# Patient Record
Sex: Female | Born: 1935 | Race: White | Hispanic: No
Health system: Southern US, Community
[De-identification: ages and names within clinical notes are randomized; demographics above are authoritative.]

---

## 2009-01-28 ENCOUNTER — Ambulatory Visit: Payer: Self-pay | Admitting: Internal Medicine

## 2009-04-01 ENCOUNTER — Ambulatory Visit: Payer: Self-pay | Admitting: Internal Medicine

## 2009-05-19 ENCOUNTER — Inpatient Hospital Stay: Payer: Self-pay | Admitting: Orthopedic Surgery

## 2009-06-28 ENCOUNTER — Ambulatory Visit: Payer: Self-pay | Admitting: Family Medicine

## 2010-01-11 ENCOUNTER — Inpatient Hospital Stay: Payer: Self-pay | Admitting: Specialist

## 2011-11-05 ENCOUNTER — Ambulatory Visit: Payer: Self-pay | Admitting: Specialist

## 2011-12-03 ENCOUNTER — Inpatient Hospital Stay: Payer: Self-pay | Admitting: Internal Medicine

## 2011-12-03 LAB — BASIC METABOLIC PANEL
Anion Gap: 10 (ref 7–16)
Calcium, Total: 9.3 mg/dL (ref 8.5–10.1)
Chloride: 104 mmol/L (ref 98–107)
Creatinine: 0.74 mg/dL (ref 0.60–1.30)
EGFR (African American): >60
EGFR (Non-African Amer.): >60
Osmolality: 281 (ref 275–301)
Potassium: 3.7 mmol/L (ref 3.5–5.1)
Sodium: 141 mmol/L (ref 136–145)

## 2011-12-03 LAB — URINALYSIS, COMPLETE
Bilirubin,UR: NEGATIVE
Blood: NEGATIVE
Glucose,UR: NEGATIVE mg/dL (ref 0–75)
Nitrite: NEGATIVE
Ph: 8 (ref 4.5–8.0)
Protein: NEGATIVE
RBC,UR: 2 /HPF (ref 0–5)
WBC UR: <1 /HPF (ref 0–5)

## 2011-12-03 LAB — CBC
HCT: 41.4 % (ref 35.0–47.0)
HGB: 13.4 g/dL (ref 12.0–16.0)
MCHC: 32.5 g/dL (ref 32.0–36.0)
Platelet: 439 10*3/uL (ref 150–440)
RBC: 4.32 10*6/uL (ref 3.80–5.20)
WBC: 7.9 10*3/uL (ref 3.6–11.0)

## 2011-12-03 LAB — PROTIME-INR: INR: 1

## 2011-12-03 LAB — APTT: Activated PTT: 31.6 secs (ref 23.6–35.9)

## 2011-12-08 ENCOUNTER — Encounter: Payer: Self-pay | Admitting: Internal Medicine

## 2011-12-17 ENCOUNTER — Ambulatory Visit: Payer: Self-pay | Admitting: Internal Medicine

## 2011-12-28 ENCOUNTER — Ambulatory Visit: Payer: Self-pay | Admitting: Family Medicine

## 2011-12-31 ENCOUNTER — Encounter: Payer: Self-pay | Admitting: Internal Medicine

## 2012-02-06 ENCOUNTER — Ambulatory Visit: Payer: Self-pay | Admitting: Emergency Medicine

## 2012-02-11 ENCOUNTER — Ambulatory Visit: Payer: Self-pay | Admitting: Family Medicine

## 2012-02-16 ENCOUNTER — Ambulatory Visit: Payer: Self-pay | Admitting: Emergency Medicine

## 2012-02-23 ENCOUNTER — Ambulatory Visit: Payer: Self-pay | Admitting: Emergency Medicine

## 2013-06-29 ENCOUNTER — Emergency Department: Payer: Self-pay | Admitting: Emergency Medicine

## 2013-06-29 LAB — URINALYSIS, COMPLETE
Bilirubin,UR: NEGATIVE
Blood: NEGATIVE
Ketone: NEGATIVE
Nitrite: NEGATIVE
Ph: 9 (ref 4.5–8.0)
Protein: NEGATIVE
RBC,UR: 2 /HPF (ref 0–5)

## 2013-06-29 LAB — COMPREHENSIVE METABOLIC PANEL
Albumin: 3.5 g/dL (ref 3.4–5.0)
BUN: 7 mg/dL (ref 7–18)
Bilirubin,Total: 0.4 mg/dL (ref 0.2–1.0)
Calcium, Total: 9.6 mg/dL (ref 8.5–10.1)
Co2: 30 mmol/L (ref 21–32)
Creatinine: 0.74 mg/dL (ref 0.60–1.30)
EGFR (African American): >60
Glucose: 104 mg/dL — ABNORMAL HIGH (ref 65–99)
Potassium: 3.4 mmol/L — ABNORMAL LOW (ref 3.5–5.1)
SGOT(AST): 17 U/L (ref 15–37)
SGPT (ALT): 16 U/L (ref 12–78)
Sodium: 133 mmol/L — ABNORMAL LOW (ref 136–145)
Total Protein: 7.1 g/dL (ref 6.4–8.2)

## 2013-06-29 LAB — CBC
HCT: 35.1 % (ref 35.0–47.0)
HGB: 11.9 g/dL — ABNORMAL LOW (ref 12.0–16.0)
MCHC: 33.9 g/dL (ref 32.0–36.0)
RBC: 3.79 10*6/uL — ABNORMAL LOW (ref 3.80–5.20)
RDW: 14.2 % (ref 11.5–14.5)
WBC: 5.5 10*3/uL (ref 3.6–11.0)

## 2013-06-29 LAB — TROPONIN I: Troponin-I: <0.02 ng/mL

## 2013-06-29 LAB — CK TOTAL AND CKMB (NOT AT ARMC): CK, Total: 36 U/L (ref 21–215)

## 2013-06-29 LAB — PRO B NATRIURETIC PEPTIDE: B-Type Natriuretic Peptide: 857 pg/mL — ABNORMAL HIGH (ref 0–450)

## 2013-10-02 IMAGING — RF DG BARIUM SWALLOW
1 series · 14 of 23 positions shown · non-contrast
Comparison: none

REASON FOR EXAM: difficulty swallowing   hiatal hernia
COMMENTS:

[Series 1: run · 17 acquisitions, 14 frames shown]
[im 1/17]
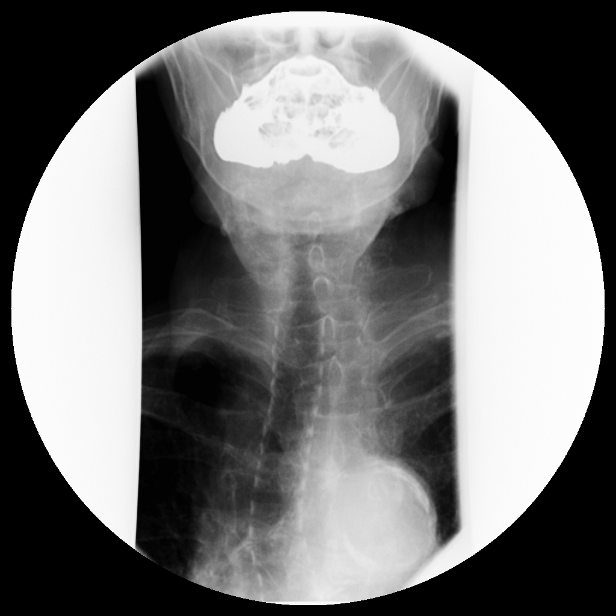
[im 1/17]
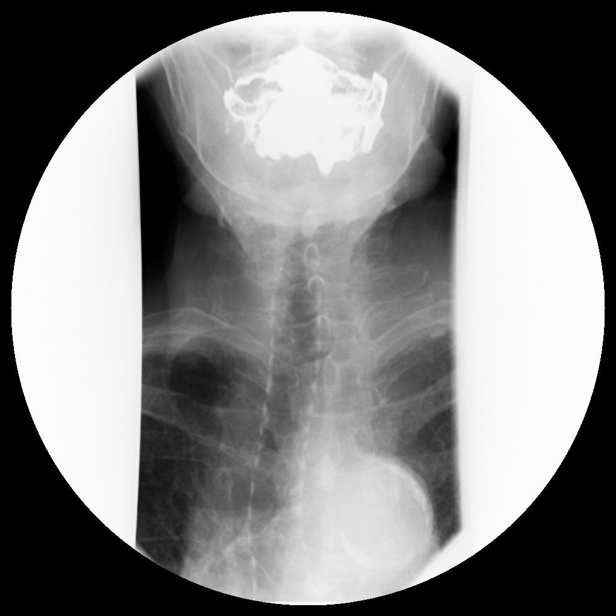
[im 2/17]
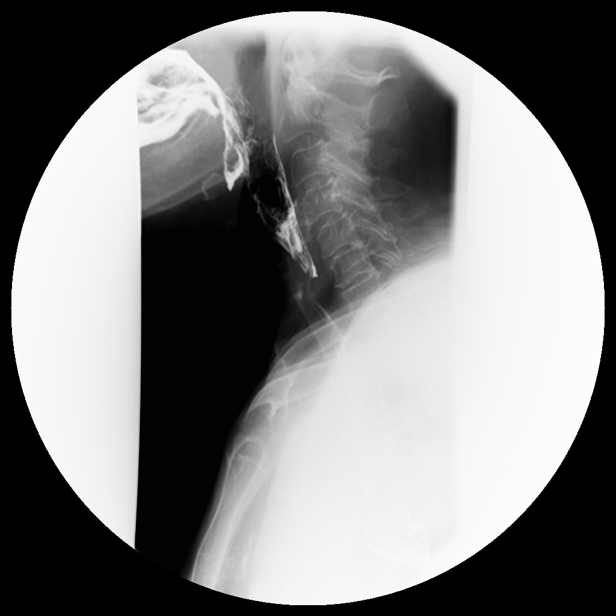
[im 2/17]
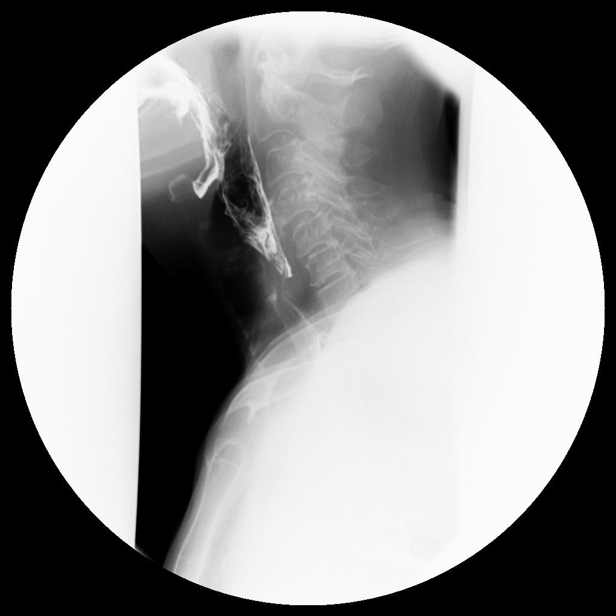
[im 2/17]
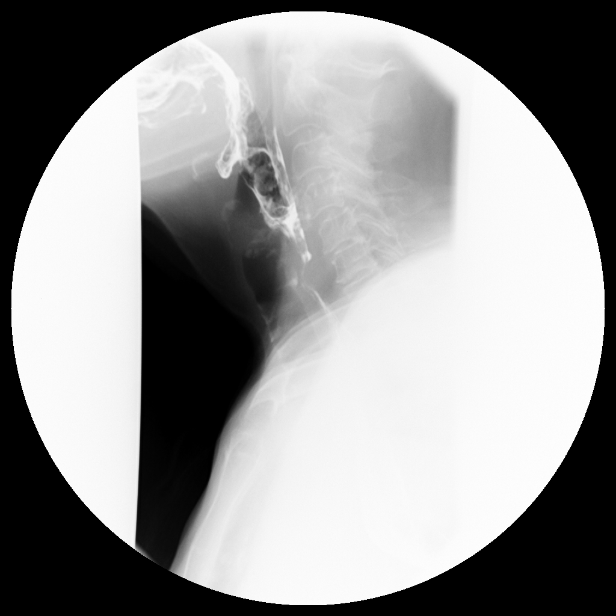
[im 4/17]
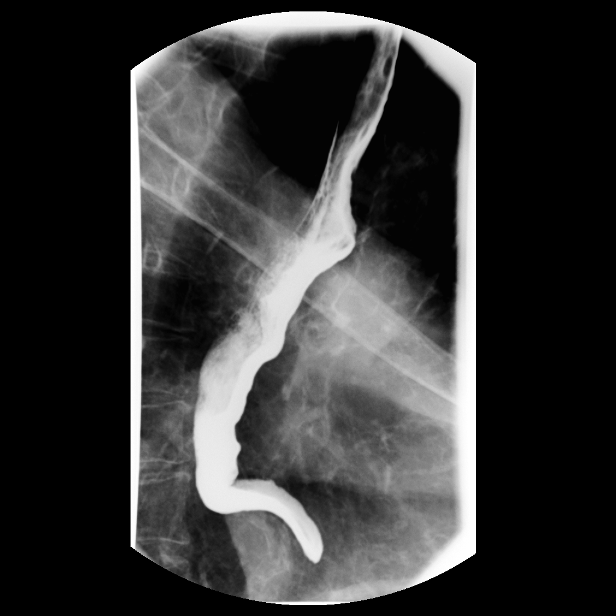
[im 5/17]
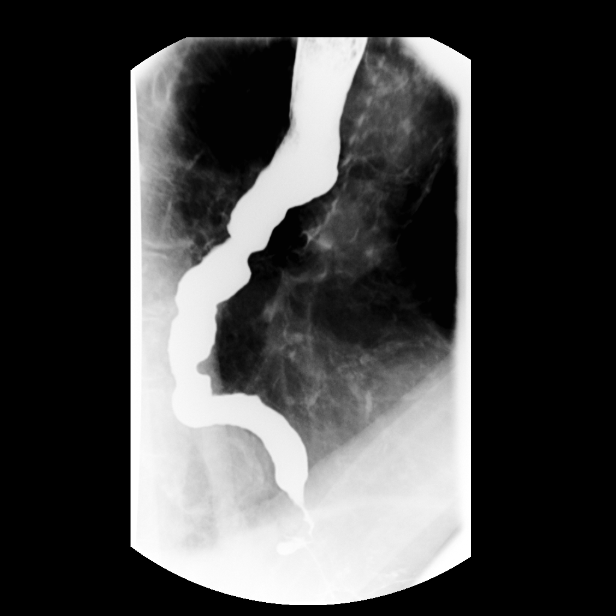
[im 7/17]
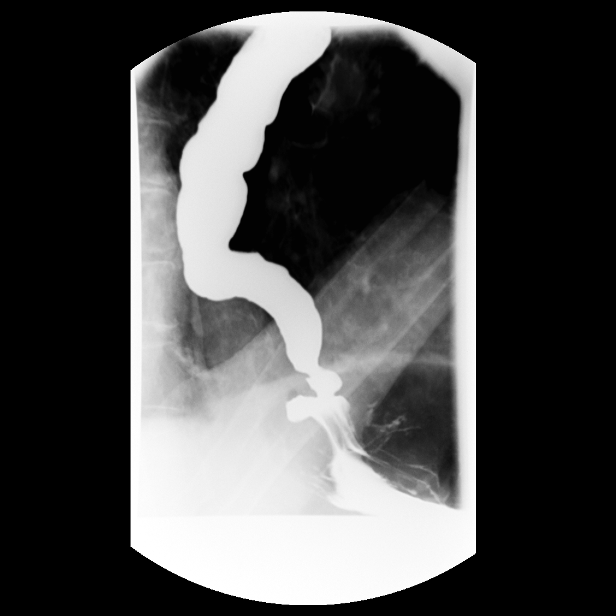
[im 8/17]
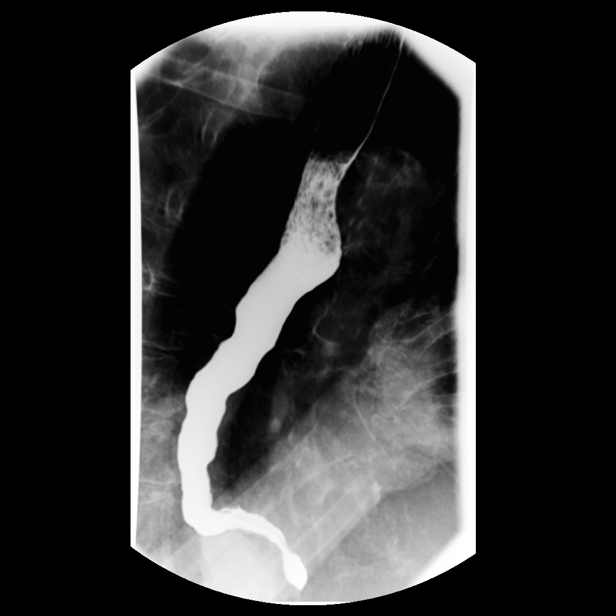
[im 10/17]
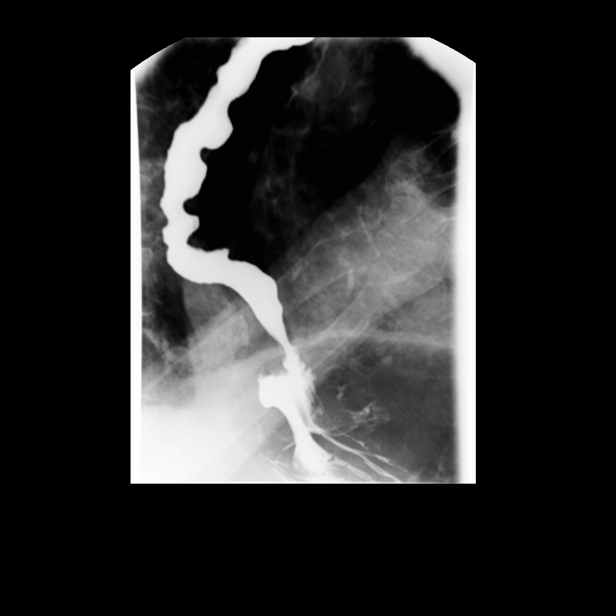
[im 12/17]
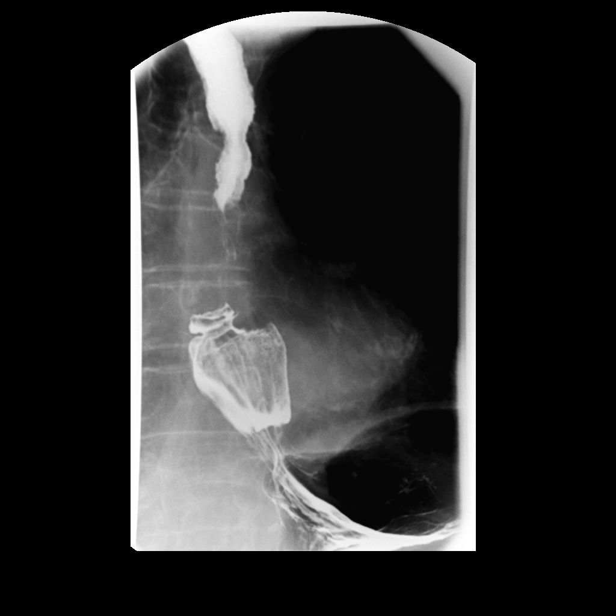
[im 13/17]
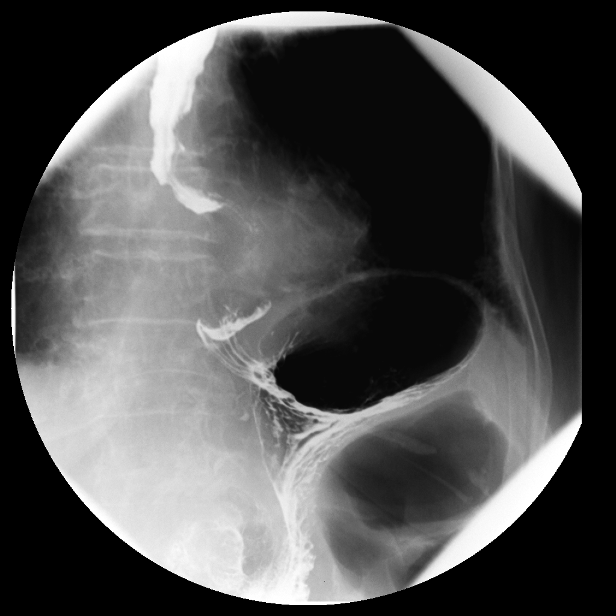
[im 15/17]
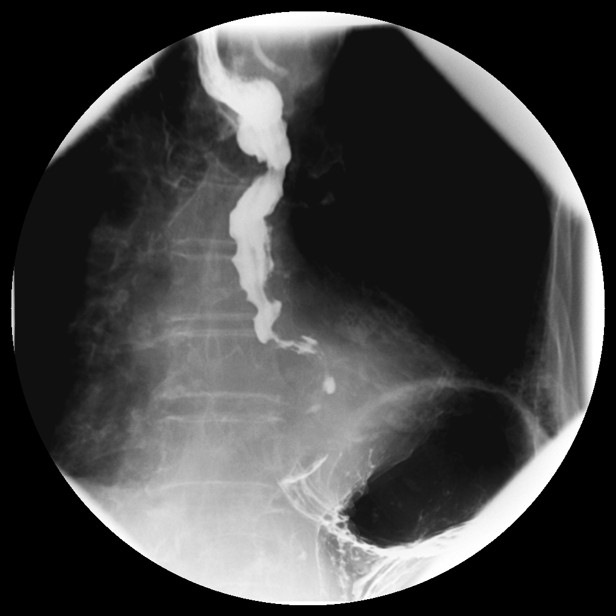
[im 17/17]
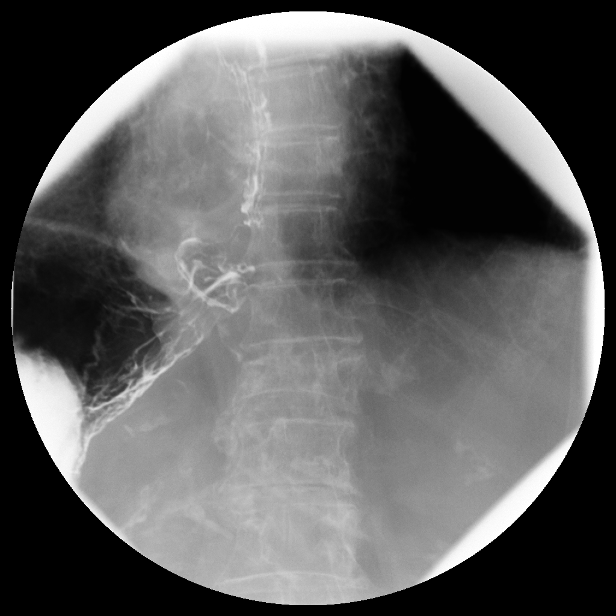

[14 of 23 positions shown; findings below may reference images not displayed]

PROCEDURE:     FL  - FL BARIUM SWALLOW  - December 17, 2011  [DATE]

RESULT:

Procedure: Dynamic imaging of the cervical esophagus was performed during
active swallowing. This was followed by the administration of effervescent
crystals and readministration of oral barium. Representative spot imaging
was obtained.

Evaluation of the cervical esophagus demonstrates a persistent area of
irregularity along the anterior aspect of the proximal esophagus at the
level of the C5 vertebral body. This may represent the sequela of an ulcer
within the proximal cervical esophagus. Further evaluation with direct
visualization is recommended.

Status post readministration of oral barium multiple filling defects are
identified within the thoracic esophagus suspicious for debris. The patient
demonstrates constant tertiary contractions during swallowing and the
esophagus is tortuous. A small hiatal hernia is identified within the distal
esophagus. There are findings which may represent an area of mild narrowing
within the distal esophagus. The study was aborted secondary to the
patient's complaint of pain during swallowing as well as patient's
complaints of pain during positioning.
IMPRESSION: 1. Findings concerning for filling defect possibly ulcer along the anterior
surface of the upper to mid cervical esophagus.
2. Findings which may represent mild narrowing of the distal thoracic
esophagus. A mass within this area cannot be completely excluded and further
evaluation with direct visualization is recommended.
3. Hiatal hernia.
4. This study was also aborted secondary to the residual debris within the
esophagus and the concern of aspiration during persistent administration of
barium.

## 2014-05-02 DEATH — deceased

## 2014-12-24 NOTE — Discharge Summary (Signed)
PATIENT NAME:  Olivia Holloway, ROENIA S MR#:  [id number] DATE OF BIRTH:  12/13/1917  DATE OF ADMISSION:  12/03/2011 DATE OF DISCHARGE:  12/08/2011   DISCHARGE DIAGNOSES:  1. Vertebral compression fracture. 2. Ileus.  3. Abdominal aortic aneurysm measuring 7.8 cm, not a surgical candidate.  4. Emphysema.   DISCHARGE MEDICATIONS:  1. Aspirin 81 mg daily.  2. Fiber-Lax 625 mg daily.  3. Milk of Magnesia 30 mL p.r.n.  4. Simethicone 125 mg daily.  5. Ventolin 1 puff b.i.d.  6. Advair 250/50 1 puff b.i.d.  7. Norco 5/325 one-half tab q.6 p.r.n.  8. Magnesium citrate 30 to 60 mL b.i.d. p.r.n. constipation.  9. MiraLAX 17 grams daily as needed for constipation.   REASON FOR ADMISSION: This is a 81 year old female who fell and suffered a T12-L3 compression fracture. She is not a candidate for kyphoplasty because of her advanced age and her large abdominal aortic aneurysm. She does complain of pain. She was admitted for further evaluation.   HOSPITAL COURSE:  1. Vertebral compression fracture. She was seen by physical therapy and ambulated with assistance. Pain took a few days to get controlled but she is ambulating now with a walker and will be getting home PT.  2. Ileus. She was going to be discharged yesterday but she had abdominal distention and constipation. X-ray was ordered and showed an ileus with a possible partial large bowel obstruction. Last night she had three bowel movements. Her abdomen is soft and nondistended today, good bowel sounds. Follow-up x-ray shows a little bit improved but she is tolerating diet so at this point will go ahead and send her home with the above bowel regimen if needed.  3. Abdominal aortic aneurysm now measuring 7.8 cm which is larger than last time it was measured in May of 2011. However, at this point she is not even being followed by repeat ultrasounds or CTs because she has been deemed not a surgical candidate and the family wishes no further work-up.   4. Emphysema. She was continued on her current medications.   DISPOSITION:  1. The patient is discharged in stable condition.  2. She will go home with home health care and PT. 3. She will follow-up with her primary care physician in 1 to 2 weeks.   TIME SPENT ON DISCHARGE: 35 minutes.   ____________________________ Gracelyn NurseJohn D. Johnston, MD [email or web address]:drc D: 12/06/2011 09:23:49 ET T: 12/08/2011 11:36:57 ET JOB#: [id number]  cc: Gracelyn NurseJohn D. Johnston, MD, <Dictator> Jorje GuildGlenn R. Beckey DowningWillett, MD Gracelyn NurseJOHN D JOHNSTON MD ELECTRONICALLY SIGNED 12/18/2011 23:30

## 2014-12-24 NOTE — Discharge Summary (Signed)
PATIENT NAME:  Olivia RilingRIPP, Olivia S MR#:  [id number] DATE OF BIRTH:  1918/05/07  DATE OF ADMISSION:  12/03/2011 DATE OF DISCHARGE:  12/08/2011  ADDENDUM TO THE DISCHARGE SUMMARY DICTATED BY DR. Letitia LibraJOHNSTON ON 12/06/2011  FINAL MEDICATION LIST:   1. Aspirin 81 mg p.o. daily.  2. Tylenol 650 mg p.o. q.6 p.r.n.  3. Norco 5/325 mg 1 to 2 q.6 p.r.n.  4. Advair 250/50, 1 puff b.i.d.  5. Restoril 7.5 mg p.o. at bedtime.  6. FiberCon 625 mg p.o. daily.  7. Albuterol oral inhaler 2 puffs q.4 p.r.n.  8. Mylanta 80 mg p.o. daily.  9. Milk of magnesia 30 mL p.o. b.i.d. p.r.n.   FOLLOW UP: Follow up with Dr. Barry BrunnerGlenn Willett after discharge from rehab in 1 to 2 weeks.   HOSPITAL COURSE: Patient was doing well. She tolerated physical therapy well. With her back pain family was concerned about her being discharged to home hence she was evaluated by physical therapy. Discussed with care managers and patient will be discharged to rehab today once family picks a choice on the rehab facility. Patient remained a NO CODE, DO NOT RESUSCITATE.   TIME SPENT: 40 minutes.  ____________________________ Wylie HailSona A. Allena KatzPatel, MD sap:cms D: 12/08/2011 11:38:08 ET T: 12/08/2011 12:09:56 ET JOB#: [id number] cc: Broden Holt A. Allena KatzPatel, MD, <Dictator> Jorje GuildGlenn R. Beckey DowningWillett, MD Willow OraSONA A Llewelyn Sheaffer MD ELECTRONICALLY SIGNED 12/08/2011 14:42

## 2014-12-24 NOTE — H&P (Signed)
PATIENT NAME:  Olivia Holloway, Olivia Holloway MR#:  [id number] DATE OF BIRTH:  September 16, 1917  DATE OF ADMISSION:  12/03/2011  PRIMARY CARE PHYSICIAN:  Dr. Barry Brunner  CHIEF COMPLAINT: Back pain.   HISTORY OF PRESENT ILLNESS: This is a 81 year old female who fell earlier today. She suffered a T12-L1 compression fracture. She is not a candidate for kyphoplasty because of her age and also she has a large abdominal aortic aneurysm. She complains of back pain with pressure on her feet. She also complains of some soreness around the abdominal area from when she said she twisted when she fell. She is very hard of hearing. She is able to answer all questions. Her daughters that are in the room with her say she is a little bit hyped up from the morphine they gave her.   PAST MEDICAL HISTORY:  1. Emphysema.  2. Reflux.  3. 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  4. Abdominal aortic aneurysm last measuring 6.8 cm in May 2011. Deemed nonsurgical because of age.   PAST SURGICAL HISTORY:  1. Tonsillectomy.  2. Uterine surgery.   ALLERGIES: Sulfa.   CURRENT MEDICATIONS:  1. Aspirin 81 mg daily.  2. Milk of Magnesia p.r.n.  3. Ventolin metered-dose inhaler 2 puffs q. 4 p.r.n.  4. Advair Diskus 250/50 1 puff daily.   SOCIAL HISTORY: Lives at home.   FAMILY HISTORY: Significant for brain aneurysm in her father.     REVIEW OF SYSTEMS: CONSTITUTIONAL: No fever or chills. EYES: She has some blurred vision. HEENT: She is hard of hearing. CARDIOVASCULAR: No chest pain. PULMONARY: No shortness of breath. GI: No nausea, vomiting, or diarrhea. GU: No dysuria.  ENDOCRINE: No heat or cold intolerance. INTEGUMENT: No rash. MUSCULOSKELETAL: Occasional joint pain. NEUROLOGIC: No numbness or weakness.   PHYSICAL EXAMINATION:  VITAL SIGNS: Temperature 98, pulse 100, respirations 18, blood pressure 136/70.   GENERAL: This is a well-nourished white female in no acute distress.   HEENT: The pupils are equal, round, and reactive to  light. Sclerae anicteric. Oral mucosa is moist.  Oropharynx is clear.  Nasopharynx is clear.   NECK: Supple. No JVD, lymphadenopathy, or thyromegaly.   CARDIOVASCULAR: Regular rate and rhythm with a 2/6 systolic murmur.   LUNGS: Clear to auscultation. No dullness to percussion. She is not using accessory muscles.   ABDOMEN: Soft, nontender, nondistended. There is a palpable mass in the abdominal area. There is an abdominal bruit.   EXTREMITIES: There is no edema.   NEUROLOGIC: Cranial nerves II through XII are intact. She is hard of hearing. She is alert and oriented times four.   SKIN: Moist with no rash.   DIAGNOSTIC DATA: CT scan of the abdomen shows compression fracture at T12 and L3 with no retropulsion. Also shows a large 7.8-cm abdominal aortic aneurysm with no leakage.   ASSESSMENT AND PLAN:  1. Vertebral compression fracture: She is unable to ambulate without severe pain right now. We will try to get her pain under control and get physical therapy to see her. She is not a candidate for kyphoplasty because of age and also large abdominal aortic aneurysm.  2. Abdominal aortic aneurysm: Today it is measuring 7.8 cm. The last one I could find was in May 2011 at which time it was 6.8. The daughters tell me that they not even been following this because she is not a surgical candidate and therefore wished no further work-up. I did offer for a vascular surgeon to see her, but they said  that they did not see a reason since they were not going to do surgery.  3. Emphysema: She is on a metered-dose inhaler. We will continue that.  4. CODE STATUS: Her daughter has healthcare power of attorney and has verified that she is a DNR.  TIME SPENT ON ADMISSION: 45 minutes.  ____________________________ Gracelyn NurseJohn D. Johnston, MD jdj:bjt D: 12/03/2011 17:24:09 ET T: 12/03/2011 17:44:41 ET JOB#: [id number]  cc: Gracelyn NurseJohn D. Johnston, MD, <Dictator> Jorje GuildGlenn R. Beckey DowningWillett, MD Gracelyn NurseJOHN D JOHNSTON MD ELECTRONICALLY  SIGNED 12/07/2011 23:58

## 2015-04-15 IMAGING — CR DG CHEST 1V PORT
1 series · 1 of 1 positions shown · non-contrast
Comparison: none

REASON FOR EXAM: Shortness of Breath
COMMENTS:

PROCEDURE:     DXR - DXR PORTABLE CHEST SINGLE VIEW  - June 29, 2013  [DATE]
RESULT:     The lungs are clear. The cardiac silhouette is enlarged.  The
visualized bony skeleton is unremarkable.

[ap]
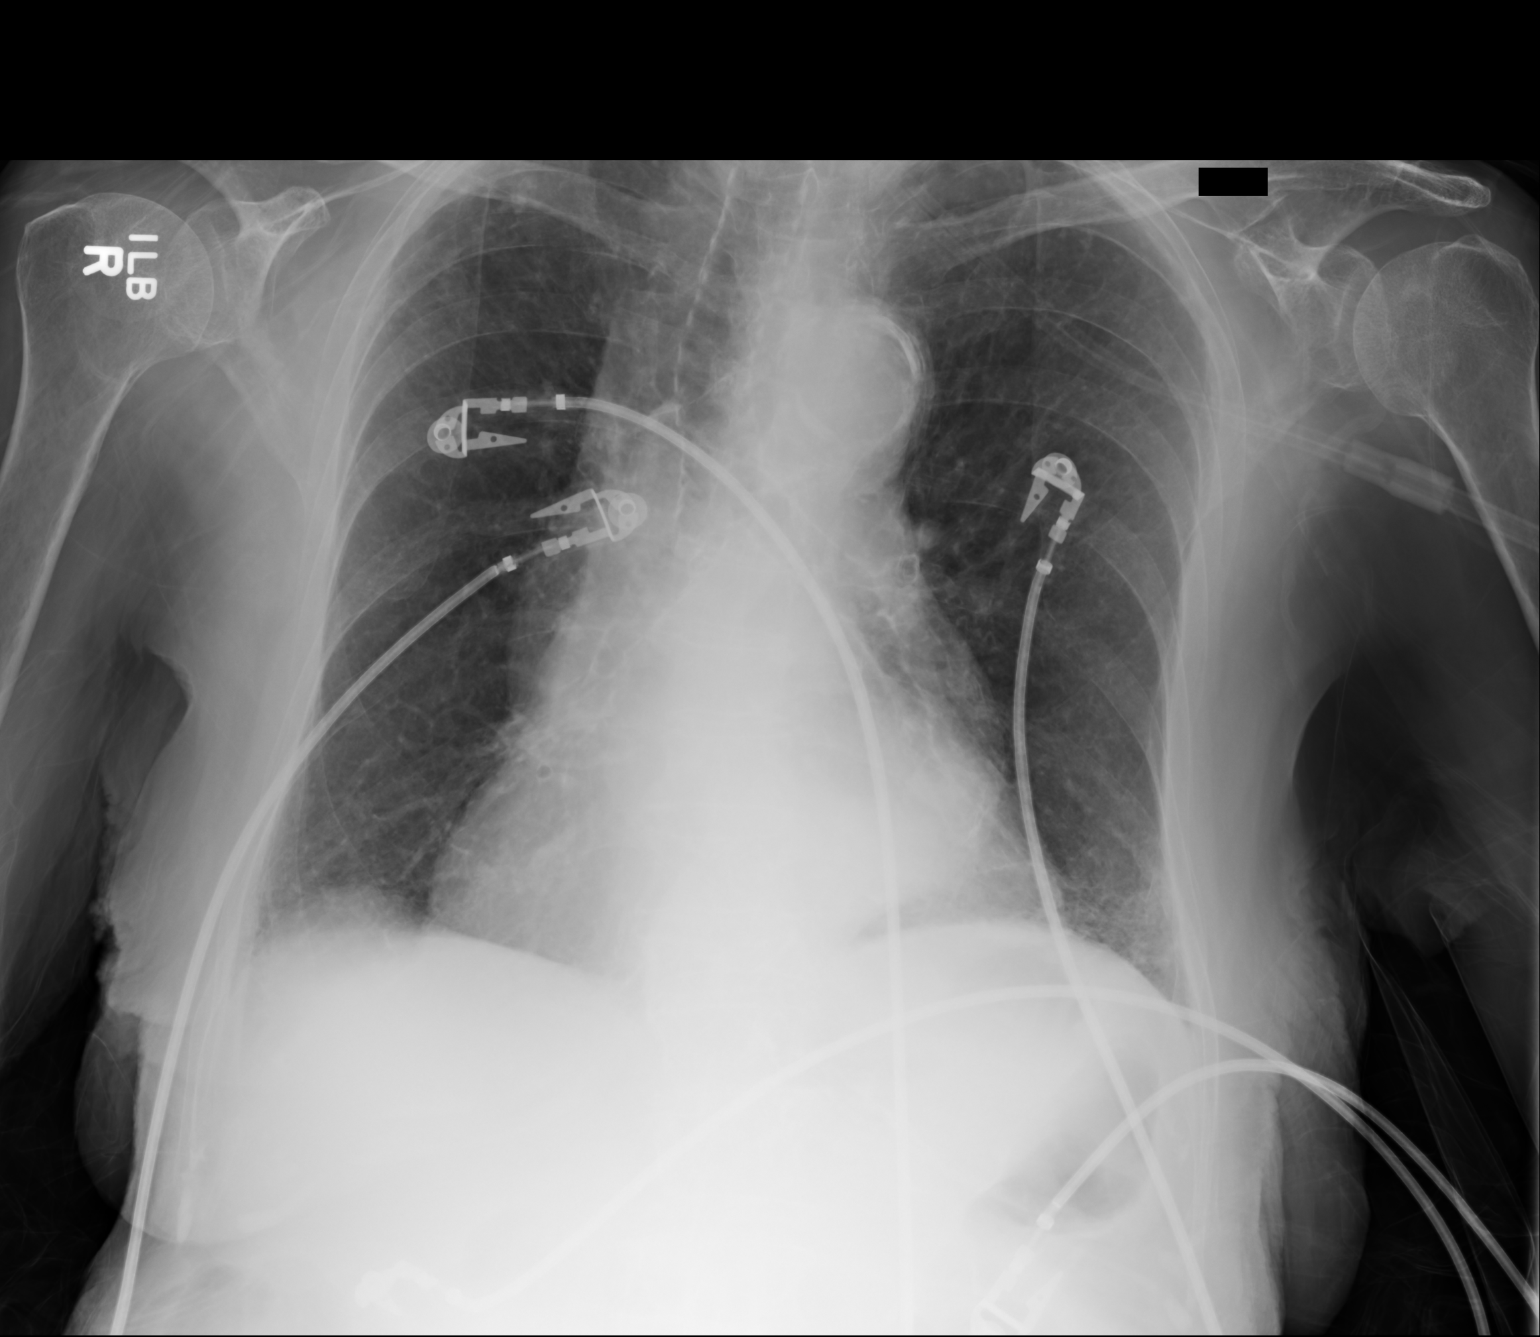

[1 of 1 positions shown; findings below may reference images not displayed]

IMPRESSION: 1. Chest radiograph without evidence of acute cardiopulmonary disease.
2. Comparison 01/11/2010
